# Patient Record
Sex: Female | Born: 1975 | Race: Black or African American | Hispanic: No | Marital: Single | Smoking: Never smoker
Health system: Southern US, Community
[De-identification: ages and names within clinical notes are randomized; demographics above are authoritative.]

## PROBLEM LIST (undated history)

## (undated) DIAGNOSIS — E119 Type 2 diabetes mellitus without complications: Secondary | ICD-10-CM

---

## 2012-09-17 ENCOUNTER — Encounter: Payer: Self-pay | Admitting: Emergency Medicine

## 2012-09-17 ENCOUNTER — Emergency Department
Admission: EM | Admit: 2012-09-17 | Discharge: 2012-09-17 | Disposition: A | Discharge status: Home / Self Care | Payer: Self-pay | Source: Home / Self Care | Attending: Family Medicine | Admitting: Family Medicine

## 2012-09-17 ENCOUNTER — Emergency Department (INDEPENDENT_AMBULATORY_CARE_PROVIDER_SITE_OTHER): Payer: Self-pay

## 2012-09-17 DIAGNOSIS — J069 Acute upper respiratory infection, unspecified: Secondary | ICD-10-CM

## 2012-09-17 DIAGNOSIS — R05 Cough: Secondary | ICD-10-CM

## 2012-09-17 MED ORDER — HYDROCOD POLST-CHLORPHEN POLST 10-8 MG/5ML PO LQCR: 5.0000 mL | Freq: Two times a day (BID) | ORAL | Status: DC | PRN

## 2012-09-17 MED ORDER — AZITHROMYCIN 250 MG PO TABS: ORAL_TABLET | ORAL | Status: DC

## 2012-09-17 NOTE — ED Provider Notes (Addendum)
History     CSN: [id number]  Arrival date & time 09/17/12  1653   First MD Initiated Contact with Patient 09/17/12 1659      Chief Complaint  Patient presents with  . Nasal Congestion   HPI URI Symptoms Onset: 2 weeks  Description: rhinorrhea, nasal congestion, cough, SOB Modifying factors:  Works in [employer]. Has not had flu shot. Has also had some L eye redness.   Symptoms Nasal discharge: yes Fever: no Sore throat: no Cough: yes Wheezing: no Ear pain: no GI symptoms: no Sick contacts: yes  Red Flags  Stiff neck: no Dyspnea: no Rash: no Swallowing difficulty: no  Sinusitis Risk Factors Headache/face pain: no Double sickening: no tooth pain: no  Allergy Risk Factors Sneezing: no Itchy scratchy throat: no Seasonal symptoms: no  Flu Risk Factors Headache: no muscle aches: no severe fatigue: no    History reviewed. No pertinent past medical history.  History reviewed. No pertinent past surgical history.  No family history on file.  History  Substance Use Topics  . Smoking status: Never Smoker   . Smokeless tobacco: Not on file  . Alcohol Use: Yes    OB History    Grav Para Term Preterm Abortions TAB SAB Ect Mult Living                  Review of Systems  All other systems reviewed and are negative.    Allergies  Review of patient's allergies indicates no known allergies.  Home Medications  No current outpatient prescriptions on file.  Pulse 78  Temp 98 F (36.7 C) (Oral)  Resp 16  Ht 5' (1.524 m)  Wt 184 lb (83.462 kg)  BMI 35.94 kg/m2  SpO2 98%  LMP 08/30/2012  Physical Exam  Constitutional: She appears well-developed and well-nourished.  HENT:  Head: Normocephalic and atraumatic.  Right Ear: External ear normal.  Left Ear: External ear normal.       +nasal erythema, rhinorrhea bilaterally, + post oropharyngeal erythema    Eyes: Conjunctivae normal are normal. Pupils are equal, round, and reactive to light.  Neck: Normal  range of motion. Neck supple.  Cardiovascular: Normal rate, regular rhythm and normal heart sounds.   Pulmonary/Chest: Effort normal and breath sounds normal.  Abdominal: Soft.  Musculoskeletal: Normal range of motion.  Neurological: She is alert.  Skin: Skin is warm.    ED Course  Procedures (including critical care time)  Labs Reviewed - No data to display No results found.  1. URI (upper respiratory infection)       MDM  CXR negative for any focal infiltrate.  Discussed supportive care and infectious red flags as well as importance of flu shots and general immunizations.  Zpak for atypical coverage.  tussionex for cough  Follow up as needed.     The patient and/or caregiver has been counseled thoroughly with regard to treatment plan and/or medications prescribed including dosage, schedule, interactions, rationale for use, and possible side effects and they verbalize understanding. Diagnoses and expected course of recovery discussed and will return if not improved as expected or if the condition worsens. Patient and/or caregiver verbalized understanding.             Doree Albee, MD 09/17/12 1744  Doree Albee, MD 09/21/12 [phone]

## 2012-09-17 NOTE — ED Notes (Signed)
Reports intermittent congestion and cough with some shortness of breath x 2 weeks. Has not had Flu Vaccination this season. No recent OTCs.

## 2012-09-29 ENCOUNTER — Telehealth: Payer: Self-pay | Admitting: *Deleted

## 2012-09-29 NOTE — ED Notes (Unsigned)
Pt called reports that her bilateral eye drainage is no better from her visit on 09/18/12. She reports having redness, watering, itching and white d/c from bilateral eyes. Denies pain. She has used OTC Red eye gtts with minimal relief. [facility] [place]. pts # [id number]   Per Dr. Cathren Harsh Begin Polytrim ophth susp; 1 gtt in each eye q3hr while awake (Max 6 doses per day) for one week. #44mL. Followup with opthalmologist if not improving 2 to 3 days.  Called rx to [facility]. Pt notified.

## 2014-01-15 IMAGING — CR DG CHEST 2V
3 series · 3 of 3 positions shown · non-contrast
Comparison: None.

CLINICAL DATA: Cough for the past 2 weeks.

CHEST - 2 VIEW

[view not recorded (1 of 3)]
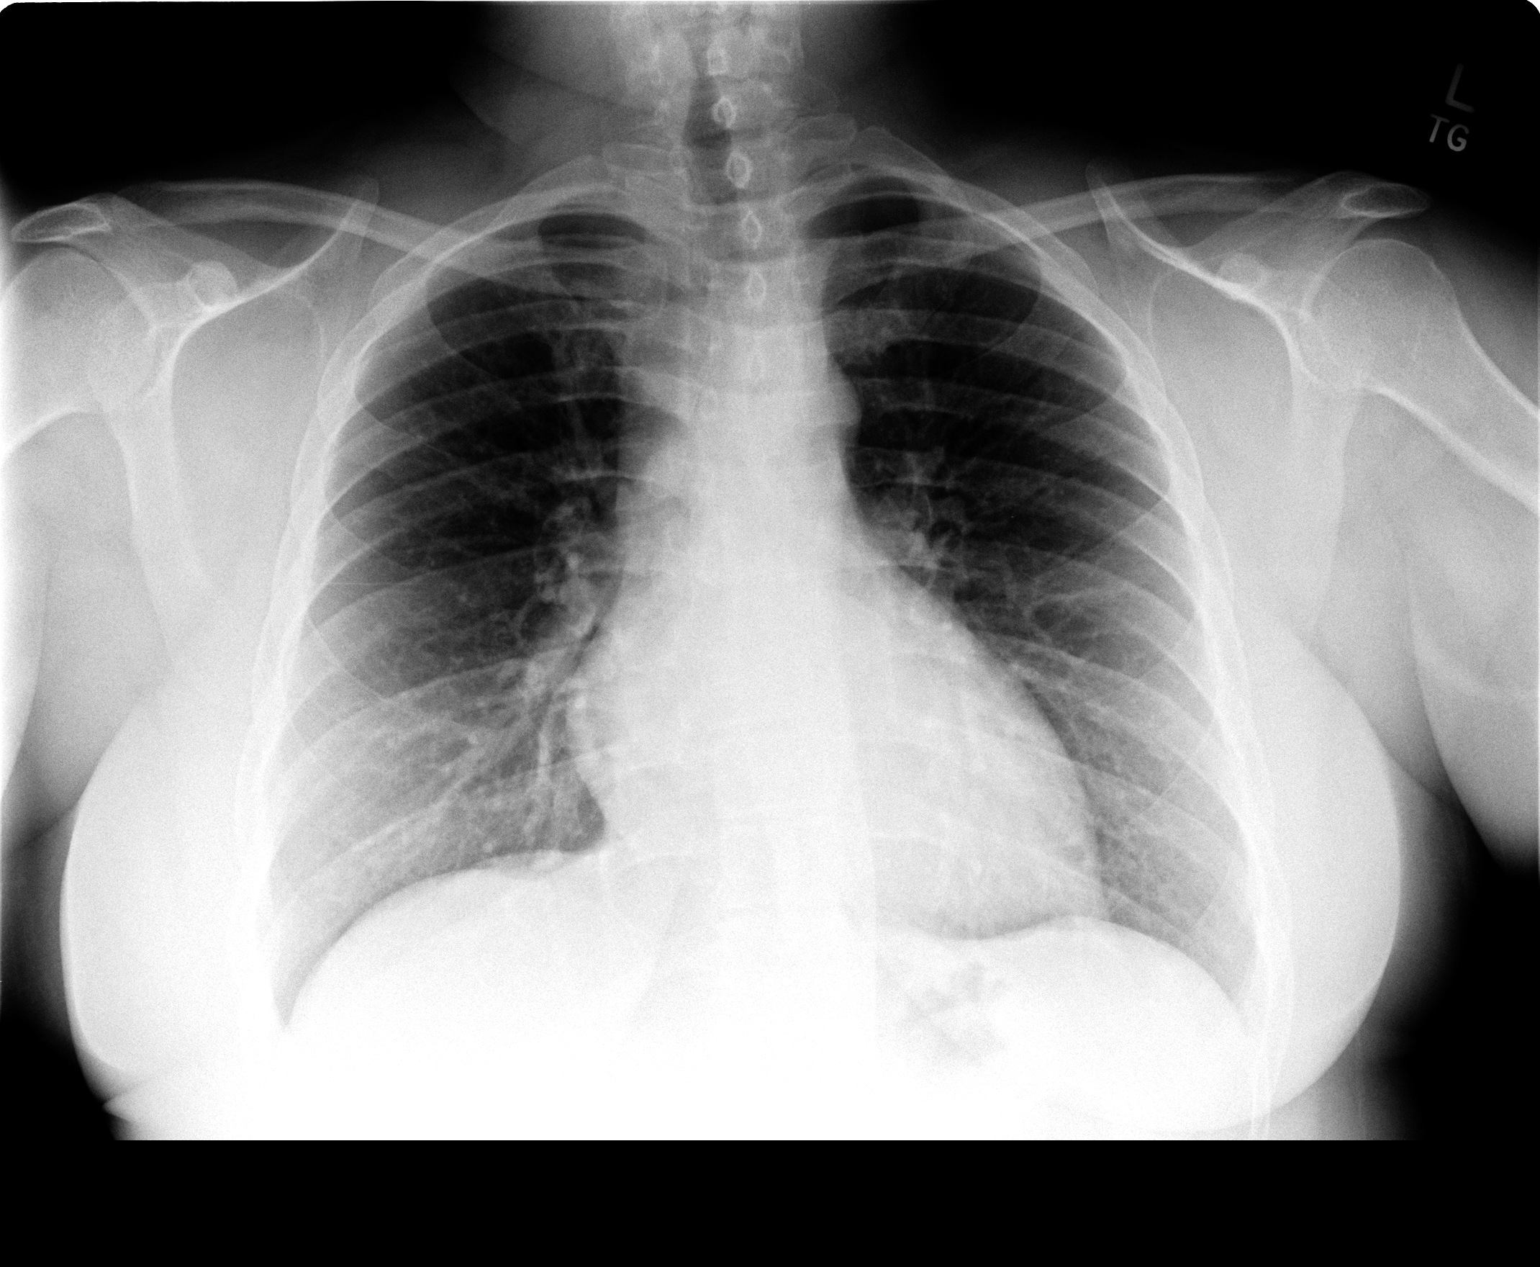

[view not recorded (2 of 3)]
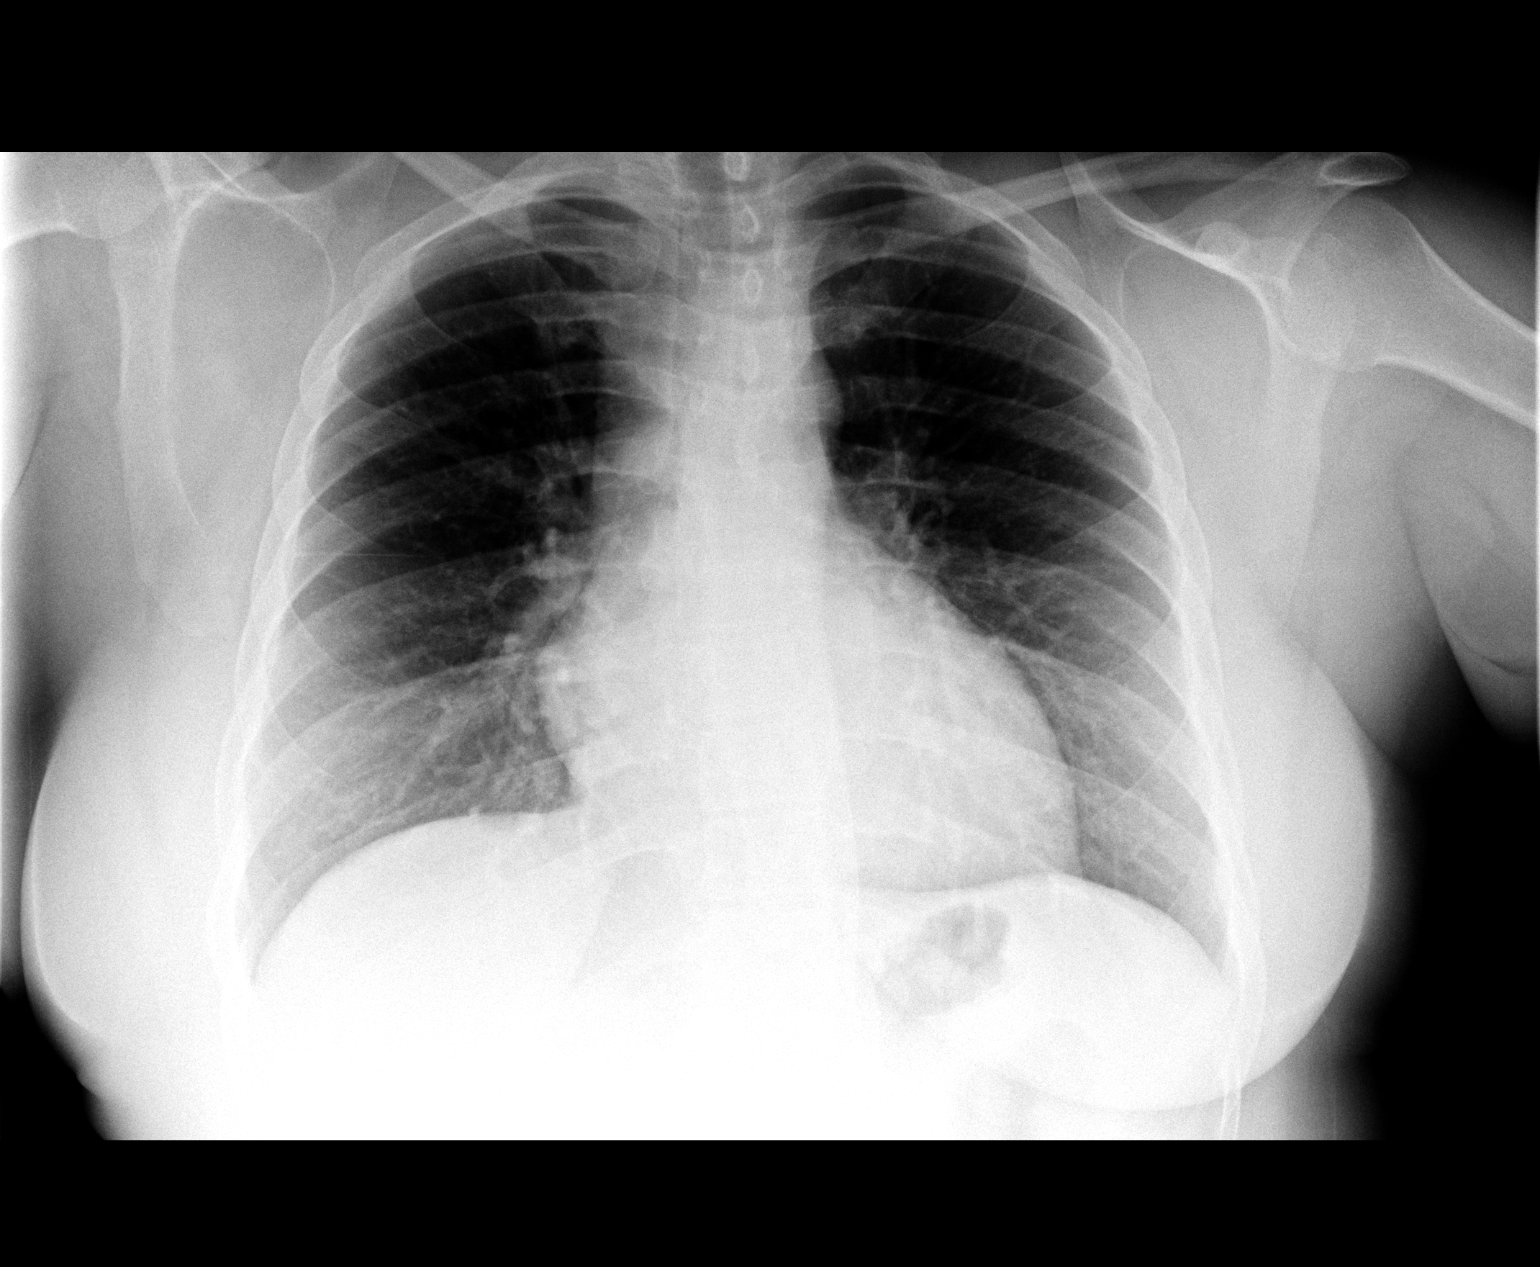

[view not recorded (3 of 3)]
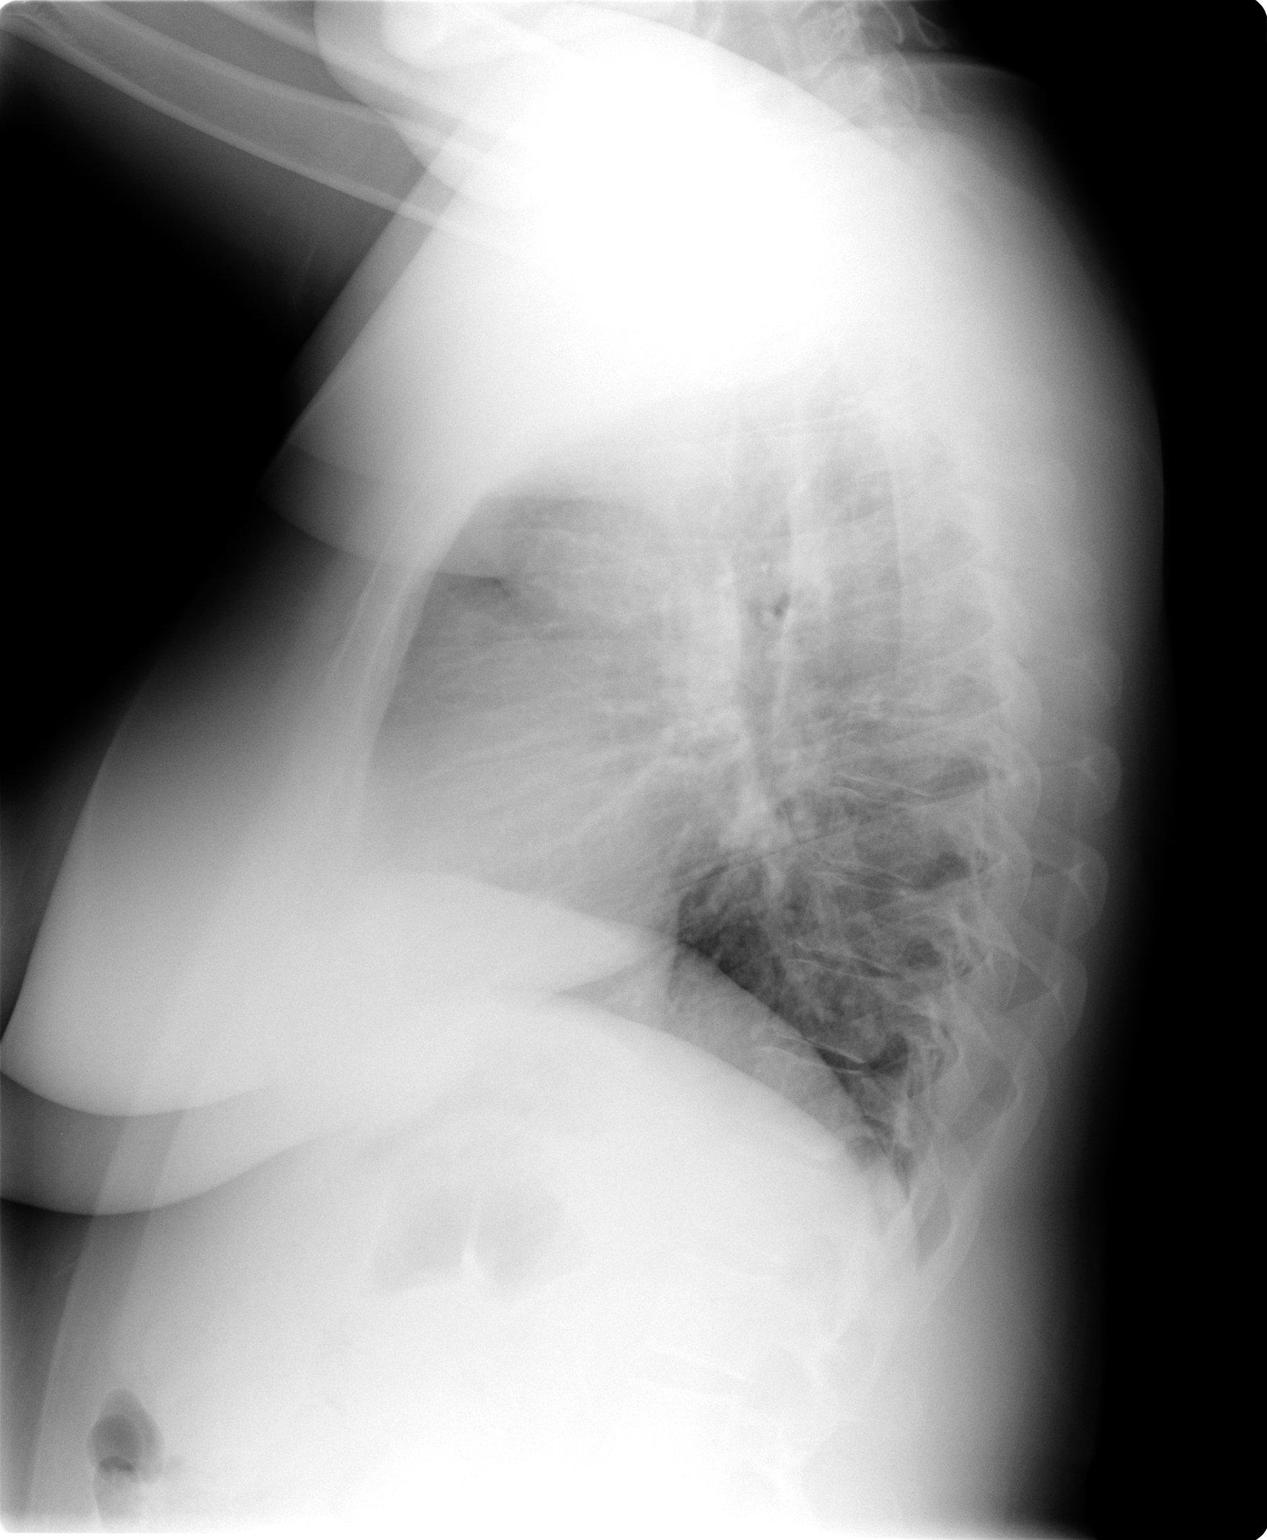

[3 of 3 positions shown; findings below may reference images not displayed]

FINDINGS: Normal sized heart.  Clear lungs.  Minimal scoliosis.
IMPRESSION: No acute abnormality.

## 2015-12-11 ENCOUNTER — Encounter: Payer: Self-pay | Admitting: *Deleted

## 2015-12-11 ENCOUNTER — Emergency Department
Admission: EM | Admit: 2015-12-11 | Discharge: 2015-12-11 | Disposition: A | Discharge status: Home / Self Care | Payer: Self-pay | Source: Home / Self Care | Attending: Family Medicine | Admitting: Family Medicine

## 2015-12-11 DIAGNOSIS — J209 Acute bronchitis, unspecified: Secondary | ICD-10-CM

## 2015-12-11 HISTORY — DX: Type 2 diabetes mellitus without complications: E11.9

## 2015-12-11 MED ORDER — BENZONATATE 200 MG PO CAPS: 200.0000 mg | ORAL_CAPSULE | Freq: Every day | ORAL | Status: AC

## 2015-12-11 MED ORDER — AZITHROMYCIN 250 MG PO TABS: ORAL_TABLET | ORAL | Status: AC

## 2015-12-11 NOTE — Discharge Instructions (Signed)
Take plain guaifenesin (1200mg extended release tabs such as Mucinex) twice daily, with plenty of water, for cough and congestion.  May add Pseudoephedrine (30mg, one or two every 4 to 6 hours) for sinus congestion.  Get adequate rest.   °Stop all antihistamines for now, and other non-prescription cough/cold preparations. °  °Follow-up with family doctor if not improving about 7 to 10 days.  °

## 2015-12-11 NOTE — ED Notes (Signed)
Pt c/o cough with clear sputum x 3 weeks. Cough is worse when sitting and at night. Taken Robitussin without relief. No h/o asthma.

## 2015-12-11 NOTE — ED Provider Notes (Signed)
CSN: [id number]     Arrival date & time 12/11/15  1514 History   First MD Initiated Contact with Patient 12/11/15 1523     Chief Complaint  Patient presents with  . Cough      HPI Comments: About 3 weeks ago patient developed typical cold-like symptoms developing over several days,  including mild sore throat, sinus congestion, headache, fatigue, and cough.  Her sore throat and sinus congestion resolved, but her non-productive cough has persisted.  She often coughs until she gags.  She believes that her last Tdap was in 2001.  The history is provided by the patient.    Past Medical History  Diagnosis Date  . Diabetes mellitus without complication [facility])    Past Surgical History  Procedure Laterality Date  . Cesarean section     History reviewed. No pertinent family history. Social History  Substance Use Topics  . Smoking status: Never Smoker   . Smokeless tobacco: None  . Alcohol Use: Yes   OB History    No data available     Review of Systems + sore throat + cough No pleuritic pain No wheezing + nasal congestion + post-nasal drainage No sinus pain/pressure No itchy/red eyes No earache No hemoptysis No SOB No fever/chills No nausea No vomiting No abdominal pain No diarrhea No urinary symptoms No skin rash + fatigue No myalgias No headache Used OTC meds without relief  Allergies  Metformin and related  Home Medications   Prior to Admission medications   Medication Sig Start Date End Date Taking? Authorizing Provider  glipiZIDE (GLUCOTROL) 5 MG tablet Take by mouth daily before breakfast.   Yes Historical Provider, MD  azithromycin (ZITHROMAX Z-PAK) 250 MG tablet Take 2 tabs today; then begin one tab once daily for 4 more days. 12/11/15   Lattie Haw, MD  benzonatate (TESSALON) 200 MG capsule Take 1 capsule (200 mg total) by mouth at bedtime. Take as needed for cough 12/11/15   Lattie Haw, MD   Meds Ordered and Administered this Visit   Medications - No data to display  BP 144/87 mmHg  Pulse 71  Temp(Src) 97.7 F (36.5 C) (Oral)  Wt 175 lb (79.379 kg)  SpO2 98%  LMP 07/02/2015 No data found.   Physical Exam Nursing notes and Vital Signs reviewed. Appearance:  Patient appears stated age, and in no acute distress Eyes:  Pupils are equal, round, and reactive to light and accomodation.  Extraocular movement is intact.  Conjunctivae are not inflamed  Ears:  Canals normal.  Tympanic membranes normal.  Nose:  Mildly congested turbinates.  No sinus tenderness.   Pharynx:  Normal Neck:  Supple.  Tender enlarged posterior/lateral nodes are palpated bilaterally  Lungs:  Clear to auscultation.  Breath sounds are equal.  Moving air well. Heart:  Regular rate and rhythm without murmurs, rubs, or gallops.  Abdomen:  Nontender without masses or hepatosplenomegaly.  Bowel sounds are present.  No CVA or flank tenderness.  Extremities:  No edema.  Skin:  No rash present.   ED Course  Procedures none    MDM   1. Acute bronchitis, unspecified organism; ?pertussis    Begin Z-pak for atypical coverage.  Prescription written for Benzonatate [facility]) to take at bedtime for Take plain guaifenesin (  extended release tabs such as Mucinex) twice daily, with plenty of water, for cough and congestion.  May add Pseudoephedrine ( , one or two every 4 to 6 hours) for sinus congestion.  Get adequate rest.  Stop all antihistamines for now, and other non-prescription cough/cold preparations.   Follow-up with family doctor if not improving about 7 to10 days. night-time cough.      Lattie HawStephen A Nikolaj Geraghty, MD 12/11/15 [phone]

## 2015-12-13 ENCOUNTER — Telehealth: Payer: Self-pay

## 2019-11-12 ENCOUNTER — Ambulatory Visit: Payer: Self-pay | Attending: Internal Medicine

## 2019-11-12 DIAGNOSIS — Z23 Encounter for immunization: Secondary | ICD-10-CM | POA: Insufficient documentation

## 2019-11-12 NOTE — Progress Notes (Signed)
   Covid-19 Vaccination Clinic  Name:  April Rosario    MRN: [id number] DOB: 04/19/1976  11/12/2019  [name] was observed post Covid-19 immunization for 15 minutes without incidence. She was provided with Vaccine Information Sheet and instruction to access the V-Safe system.   [name] was instructed to call 911 with any severe reactions post vaccine: Marland Kitchen Difficulty breathing  . Swelling of your face and throat  . A fast heartbeat  . A bad rash all over your body  . Dizziness and weakness    Immunizations Administered    Name Date Dose VIS Date Route   Pfizer COVID-19 Vaccine 11/12/2019 10:34 AM 0.3 mL 08/26/2019 Intramuscular   Manufacturer: [employer], [employer]   Lot: [id number]   NDC: [address]-5

## 2019-12-03 ENCOUNTER — Ambulatory Visit: Payer: Self-pay

## 2019-12-07 ENCOUNTER — Ambulatory Visit: Payer: Self-pay
